# Patient Record
Sex: Male | Born: 1980 | Race: White | Hispanic: No | Marital: Single | Smoking: Current every day smoker
Health system: Southern US, Community
[De-identification: ages and names within clinical notes are randomized; demographics above are authoritative.]

---

## 2014-04-20 ENCOUNTER — Emergency Department (HOSPITAL_COMMUNITY)
Admission: EM | Admit: 2014-04-20 | Discharge: 2014-04-20 | Disposition: A | Discharge status: Home / Self Care | Payer: Worker's Compensation | Attending: Emergency Medicine | Admitting: Emergency Medicine

## 2014-04-20 ENCOUNTER — Emergency Department (HOSPITAL_COMMUNITY): Payer: Worker's Compensation

## 2014-04-20 ENCOUNTER — Encounter (HOSPITAL_COMMUNITY): Payer: Self-pay | Admitting: Emergency Medicine

## 2014-04-20 DIAGNOSIS — S82109A Unspecified fracture of upper end of unspecified tibia, initial encounter for closed fracture: Secondary | ICD-10-CM

## 2014-04-20 DIAGNOSIS — S82409A Unspecified fracture of shaft of unspecified fibula, initial encounter for closed fracture: Secondary | ICD-10-CM | POA: Insufficient documentation

## 2014-04-20 DIAGNOSIS — S8990XA Unspecified injury of unspecified lower leg, initial encounter: Secondary | ICD-10-CM | POA: Diagnosis present

## 2014-04-20 DIAGNOSIS — Z880 Allergy status to penicillin: Secondary | ICD-10-CM | POA: Insufficient documentation

## 2014-04-20 DIAGNOSIS — Y9389 Activity, other specified: Secondary | ICD-10-CM | POA: Insufficient documentation

## 2014-04-20 DIAGNOSIS — S82101A Unspecified fracture of upper end of right tibia, initial encounter for closed fracture: Secondary | ICD-10-CM

## 2014-04-20 DIAGNOSIS — S99919A Unspecified injury of unspecified ankle, initial encounter: Principal | ICD-10-CM

## 2014-04-20 DIAGNOSIS — F17200 Nicotine dependence, unspecified, uncomplicated: Secondary | ICD-10-CM | POA: Insufficient documentation

## 2014-04-20 DIAGNOSIS — S82839A Other fracture of upper and lower end of unspecified fibula, initial encounter for closed fracture: Secondary | ICD-10-CM

## 2014-04-20 DIAGNOSIS — Y9289 Other specified places as the place of occurrence of the external cause: Secondary | ICD-10-CM | POA: Insufficient documentation

## 2014-04-20 DIAGNOSIS — S99929A Unspecified injury of unspecified foot, initial encounter: Principal | ICD-10-CM

## 2014-04-20 DIAGNOSIS — S8991XA Unspecified injury of right lower leg, initial encounter: Secondary | ICD-10-CM

## 2014-04-20 DIAGNOSIS — Z91040 Latex allergy status: Secondary | ICD-10-CM | POA: Diagnosis not present

## 2014-04-20 DIAGNOSIS — W1789XA Other fall from one level to another, initial encounter: Secondary | ICD-10-CM | POA: Diagnosis not present

## 2014-04-20 DIAGNOSIS — S82831A Other fracture of upper and lower end of right fibula, initial encounter for closed fracture: Secondary | ICD-10-CM

## 2014-04-20 MED ORDER — IBUPROFEN 800 MG PO TABS: 800.0000 mg | ORAL_TABLET | Freq: Three times a day (TID) | ORAL | Status: AC

## 2014-04-20 MED ORDER — OXYCODONE-ACETAMINOPHEN 5-325 MG PO TABS
2.0000 | ORAL_TABLET | Freq: Once | ORAL | Status: AC
  Administered 2014-04-20: 2 via ORAL
  Filled 2014-04-20: qty 2

## 2014-04-20 MED ORDER — OXYCODONE-ACETAMINOPHEN 5-325 MG PO TABS: 2.0000 | ORAL_TABLET | Freq: Four times a day (QID) | ORAL | Status: AC | PRN

## 2014-04-20 MED ORDER — MORPHINE SULFATE 4 MG/ML IJ SOLN
4.0000 mg | Freq: Once | INTRAMUSCULAR | Status: AC
  Administered 2014-04-20: 4 mg via INTRAMUSCULAR
  Filled 2014-04-20: qty 1

## 2014-04-20 NOTE — Progress Notes (Signed)
Orthopedic Tech Progress Note Patient Details:  George Dixon [birth year] [id number]  Ortho Devices Type of Ortho Device: Crutches;Knee Immobilizer Ortho Device/Splint Location: rle Ortho Device/Splint Interventions: Application   George Dixon 04/20/2014, 7:51 PM

## 2014-04-20 NOTE — ED Notes (Addendum)
Pt sts fell off scaffold approx 3 feet hitting right leg on concrete; pt with pain and possible deformity noted; CMS intact

## 2014-04-20 NOTE — ED Notes (Signed)
Ortho at the bedside. Pain meds given.

## 2014-04-20 NOTE — ED Notes (Signed)
Pt gone to radiology 

## 2014-04-20 NOTE — ED Provider Notes (Signed)
CSN: [id number]     Arrival date & time 04/20/14  1430 History   First MD Initiated Contact with Patient 04/20/14 1523     Chief Complaint  Patient presents with  . Leg Injury     (Consider location/radiation/quality/duration/timing/severity/associated sxs/prior Treatment) HPI Comments: Patient presents to the emergency department with chief complaint of right knee and leg pain. He states that he was working on a piece of scaffolding, when he fell and landed on his right knee and leg about 2 hours ago. He states that the leg buckled underneath him. He complains of moderate to severe pain to the lower leg and knee. He reports moderate swelling. He has not taken anything to alleviate the symptoms. He has been unable to ambulate. Pain is aggravated with movement and palpation.  The history is provided by the patient. No language interpreter was used.    History reviewed. No pertinent past medical history. History reviewed. No pertinent past surgical history. History reviewed. No pertinent family history. History  Substance Use Topics  . Smoking status: Current Every Day Smoker  . Smokeless tobacco: Not on file  . Alcohol Use: Yes    Review of Systems  Constitutional: Negative for fever and chills.  Respiratory: Negative for shortness of breath.   Cardiovascular: Negative for chest pain.  Gastrointestinal: Negative for nausea, vomiting, diarrhea and constipation.  Genitourinary: Negative for dysuria.  Musculoskeletal: Positive for arthralgias and joint swelling.      Allergies  Latex and Penicillins  Home Medications   Prior to Admission medications   Medication Sig Start Date End Date Taking? Authorizing Provider  ibuprofen (ADVIL,MOTRIN) 800 MG tablet Take 800 mg by mouth every 8 (eight) hours as needed (for pain).    Yes Historical Provider, MD   BP 113/62  Pulse 70  Temp(Src) 97.7 F (36.5 C) (Oral)  Resp 22  Ht 6' (1.829 m)  Wt 165 lb (74.844 kg)  BMI 22.37 kg/m2   SpO2 99% Physical Exam  Nursing note and vitals reviewed. Constitutional: He is oriented to person, place, and time. He appears well-developed and well-nourished.  HENT:  Head: Normocephalic and atraumatic.  Eyes: Conjunctivae and EOM are normal. Pupils are equal, round, and reactive to light. Right eye exhibits no discharge. Left eye exhibits no discharge. No scleral icterus.  Neck: Normal range of motion. Neck supple. No JVD present.  Cardiovascular: Normal rate, regular rhythm and normal heart sounds.  Exam reveals no gallop and no friction rub.   No murmur heard. Intact distal pulses with brisk capillary refill  Pulmonary/Chest: Effort normal and breath sounds normal. No respiratory distress. He has no wheezes. He has no rales. He exhibits no tenderness.  Abdominal: Soft. He exhibits no distension and no mass. There is no tenderness. There is no rebound and no guarding.  Musculoskeletal: Normal range of motion. He exhibits no edema and no tenderness.  Right knee tenderness palpation, no bony abnormality or deformity, moderate swelling Right lower extremity tender to palpation over the lateral aspect of the shin, moderate swelling Right ankle mildly tender to palpation with mild swelling Range of motion and strength right lower extremity is limited secondary to pain  Neurological: He is alert and oriented to person, place, and time.  Sensation intact throughout  Skin: Skin is warm and dry.  No obvious tenting of the skin, no abrasions or lacerations  Psychiatric: He has a normal mood and affect. His behavior is normal. Judgment and thought content normal.    ED Course  Procedures (including critical care time) Labs Review Labs Reviewed - No data to display  No results found for this or any previous visit. Dg Tibia/fibula Right  04/20/2014   CLINICAL DATA:  Pain post trauma  EXAM: RIGHT TIBIA AND FIBULA - 2 VIEW  COMPARISON:  Frontal and lateral views were obtained.  FINDINGS:  There is a fracture along the medial aspect of the proximal fibular diaphysis. There is also a fracture in the region of the proximal tibial epiphysis laterally without appreciable depression of the tibial plateau. No other fractures. No dislocation. There is a fat -fluid level in the suprapatellar bursa.  IMPRESSION: Fractures of the proximal tibia and fibula with fat -fluid level in the suprapatellar bursa. No more distal fractures. No dislocation.   Electronically Signed   By: Bretta Bang M.D.   On: 04/20/2014 18:20   Dg Ankle Complete Right  04/20/2014   CLINICAL DATA:  Acute right lower leg pain status post accidental fall from scaffolding  EXAM: RIGHT ANKLE - COMPLETE 3+ VIEW  COMPARISON:  Right tibia and fibula today's date  FINDINGS: The distal tibia and fibula are intact. The ankle joint mortise is preserved. The talar dome is intact. There is no acute malleolar fracture. The talus and calcaneus are intact. The soft tissues are unremarkable.  IMPRESSION: There is no acute bony abnormality of the ankle.   Electronically Signed   By: David  [place]   On: 04/20/2014 18:23   Dg Knee Complete 4 Views Right  04/20/2014   CLINICAL DATA:  Right lower leg pain  EXAM: RIGHT KNEE - COMPLETE 4+ VIEW  COMPARISON:  None.  FINDINGS: Avulsion fracture involving the anterior tibial spines.  Associated lipohemarthrosis.  This constellation of findings suggests internal derangement (ACL injury).  Additional proximal fibular fracture.  IMPRESSION: Avulsion fracture involving the anterior tibial spines with associated lipohemarthrosis, suggesting internal derangement (ACL injury).  Additional proximal fibular fracture.   Electronically Signed   By: Charline Bills M.D.   On: 04/20/2014 18:20      EKG Interpretation None      MDM   Final diagnoses:  Right knee injury, initial encounter  Closed fracture of proximal tibia, right, initial encounter  Closed fracture of proximal fibula, right, initial  encounter    Patient with mechanical fall and right lower extremity pain. Will image the affected area, treat pain, and will reassess.  7:10 PM Fractures as above.    Images reviewed in the PACS system by me personally, I agree with radiologist's impression.  Reviewed with Dr. Littie Deeds.  Will place in knee immobilizer, give crutches, pain medicine, and discharge to home with ortho follow-up.    Patient is neurovascularly intact. He is able to extend the leg completely. It is painful with flexion.    Roxy Horseman, PA-C 04/20/14 1911

## 2014-04-20 NOTE — ED Notes (Signed)
Ortho at BS

## 2014-04-20 NOTE — Discharge Instructions (Signed)
Knee Fracture, Adult A knee fracture is a break in any of the bones of the lower part of the thigh bone, the upper part of the bones of the lower leg, or of the kneecap. When the bones no longer meet the way they are supposed to it is called a dislocation. Sometimes there can be a dislocation along with fractures. SYMPTOMS  Symptoms may include pain, swelling, inability to bend the knee, deformity of the knee, and inability to walk.  DIAGNOSIS  This problem is usually diagnosed with x-rays. Special studies are sometimes done if a fracture is suspected but cannot be seen on ordinary x-rays. If vessels around the knee are injured, special tests may be done to see what the damage is. TREATMENT   The leg is usually splinted for the first couple of days to allow for swelling. After the swelling is down a cast is put on. Sometimes a cast is put on right away with the sides of the cast cut to allow the knee to swell. If the bones are in place, this may be all that is needed.  If the bones are out of place, medications for pain are given to allow them to be put back in place. If they are seriously out of place, surgery may be needed to hold the pieces or breaks in place using wires, pins, screws or metal plates.  Generally most fractures will heal in 4 to 6 weeks. HOME CARE INSTRUCTIONS   Use your crutches as directed.  To lessen the swelling, keep the injured leg elevated while sitting or lying down.  Apply ice to the injury for 15-20 minutes, 03-04 times per day while awake for 2 days. Put the ice in a plastic bag and place a thin towel between the bag of ice and your cast.  If you have a plaster or fiberglass cast:  Do not try to scratch the skin under the cast using sharp or pointed objects.  Check the skin around the cast every day. You may put lotion on any red or sore areas.  Keep your cast dry and clean.  If you have a plaster splint:  Wear the splint as directed.  You may loosen the  elastic around the splint if your toes become numb, tingle, or turn cold or blue.  Do not put pressure on any part of your cast or splint; it may break. Rest your cast only on a pillow the first 24 hours until it is fully hardened.  Your cast or splint can be protected during bathing with a plastic bag. Do not lower the cast or splint into water.  Only take over-the-counter or prescription medicines for pain, discomfort, or fever as directed by your caregiver.  See your caregiver soon if your cast gets damaged or breaks.  It is very important to keep all follow up appointments. Not following up as directed may result in a worsening of your condition or a failure of the fracture to heal properly. SEEK IMMEDIATE MEDICAL CARE IF:  You have continued severe pain.  You have more swelling than you did before the cast was put on.  The area below the fracture becomes painful.  Your skin or toenails below the injury turn blue or gray, or feel cold or numb.  There is drainage coming from under the cast.  New, unexplained symptoms develop (drugs used in treatment may produce side effects). MAKE SURE YOU:   Understand these instructions.  Will watch your condition.  Will   get help right away if you are not doing well or get worse. Document Released: 05/30/2006 Document Revised: 10/09/2011 Document Reviewed: 07/01/2007 [employer] Patient Information 2015 [employer]. This information is not intended to replace advice given to you by your health care provider. Make sure you discuss any questions you have with your health care provider.  

## 2014-04-22 NOTE — ED Provider Notes (Signed)
Medical screening examination/treatment/procedure(s) were performed by non-physician practitioner and as supervising physician I was immediately available for consultation/collaboration.   EKG Interpretation None        Perrin Gens, MD 04/22/14 1949 

## 2016-02-27 IMAGING — CR DG KNEE COMPLETE 4+V*R*
4 series · 4 of 4 positions shown · non-contrast
Comparison: None.

CLINICAL DATA: Right lower leg pain

EXAM:
RIGHT KNEE - COMPLETE 4+ VIEW

[x knee ap right]
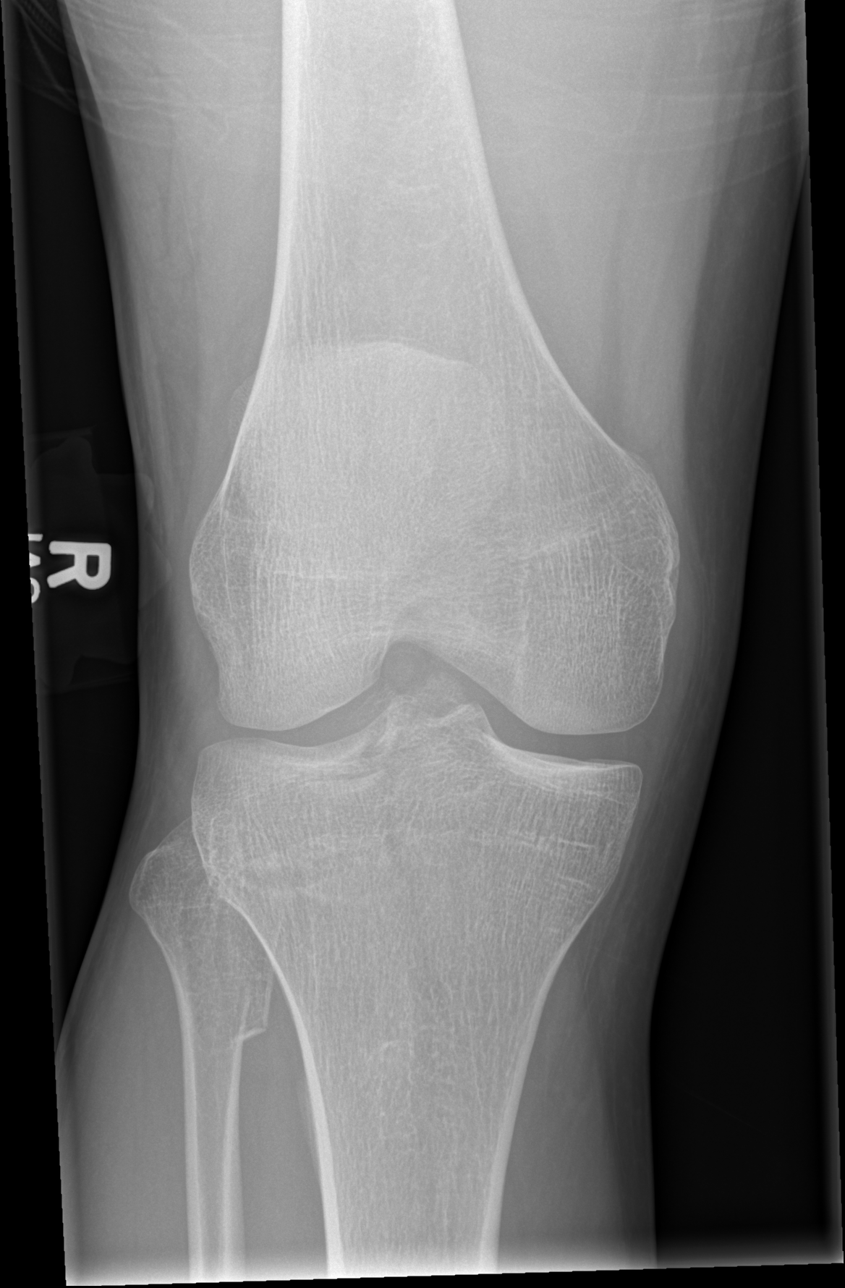

[x knee obl right (1 of 2)]
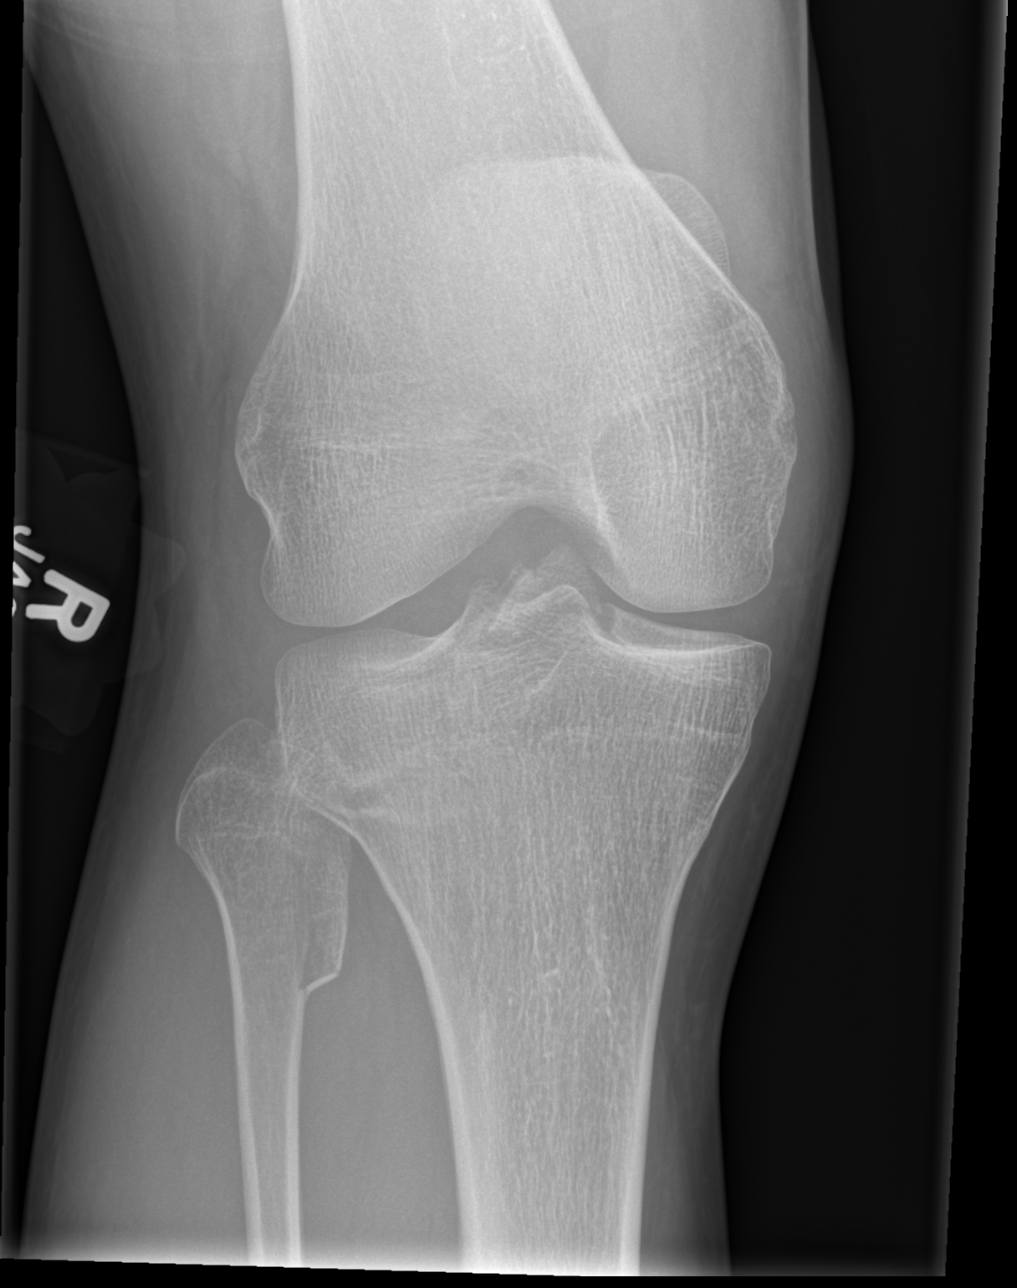

[x knee obl right (2 of 2)]
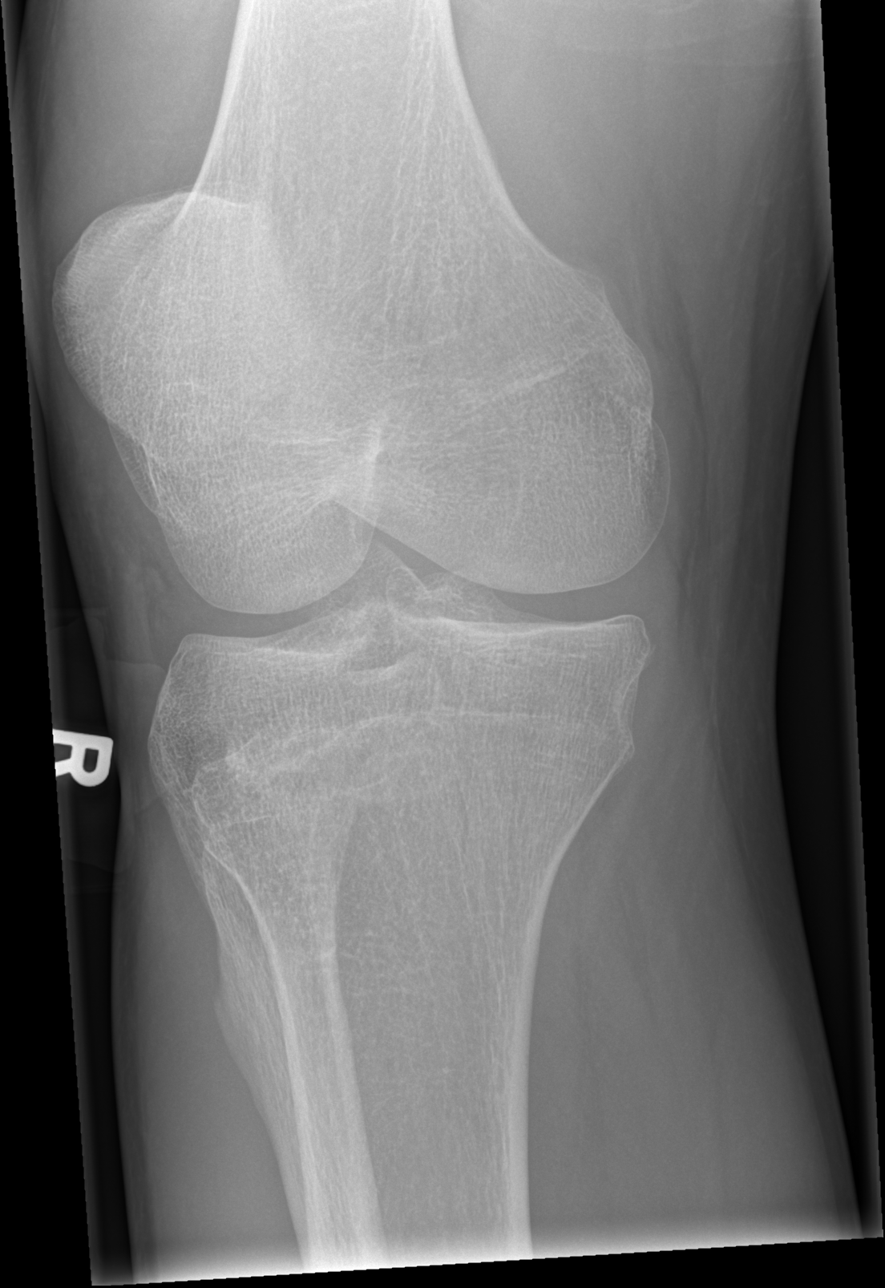

[x knee lat right]
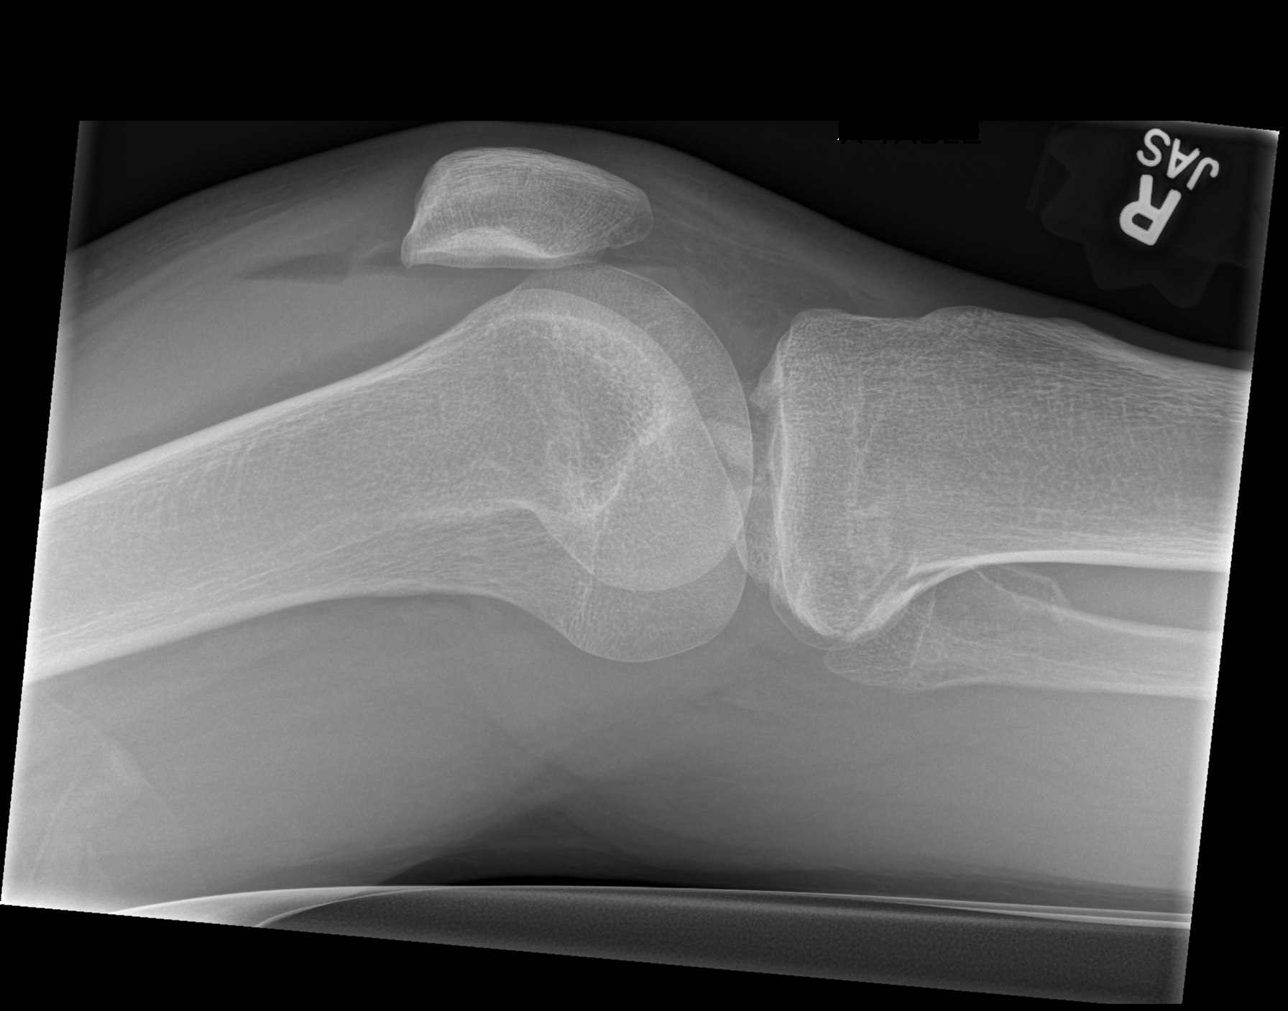

[4 of 4 positions shown; findings below may reference images not displayed]

FINDINGS: Avulsion fracture involving the anterior tibial spines.

Associated lipohemarthrosis.

This constellation of findings suggests internal derangement (ACL
injury).

Additional proximal fibular fracture.
IMPRESSION: Avulsion fracture involving the anterior tibial spines with
associated lipohemarthrosis, suggesting internal derangement (ACL
injury).

Additional proximal fibular fracture.
